# Patient Record
Sex: Female | Born: 1960 | Hispanic: Yes | Marital: Married | Smoking: Never smoker
Health system: Southern US, Community
[De-identification: ages and names within clinical notes are randomized; demographics above are authoritative.]

---

## 2004-02-25 ENCOUNTER — Ambulatory Visit: Payer: Self-pay

## 2005-06-28 ENCOUNTER — Ambulatory Visit: Payer: Self-pay

## 2006-11-01 ENCOUNTER — Ambulatory Visit: Payer: Self-pay

## 2007-12-18 ENCOUNTER — Ambulatory Visit: Payer: Self-pay

## 2010-08-06 ENCOUNTER — Ambulatory Visit: Payer: Self-pay | Admitting: Family Medicine

## 2011-10-27 ENCOUNTER — Ambulatory Visit: Payer: Self-pay | Admitting: Nurse Practitioner

## 2013-01-16 ENCOUNTER — Ambulatory Visit: Payer: Self-pay

## 2013-02-15 ENCOUNTER — Ambulatory Visit: Payer: Self-pay | Admitting: Family Medicine

## 2014-05-28 ENCOUNTER — Ambulatory Visit: Payer: Self-pay

## 2014-06-08 ENCOUNTER — Emergency Department: Payer: Self-pay | Admitting: Internal Medicine

## 2015-07-15 ENCOUNTER — Ambulatory Visit: Payer: Self-pay | Attending: Oncology

## 2015-07-15 ENCOUNTER — Ambulatory Visit
Admission: RE | Admit: 2015-07-15 | Discharge: 2015-07-15 | Disposition: A | Discharge status: Home / Self Care | Payer: Self-pay | Source: Ambulatory Visit | Attending: Oncology | Admitting: Oncology

## 2015-07-15 VITALS — BP 136/78 | HR 83 | Temp 98.0°F | Resp 20 | Ht 59.06 in | Wt 151.2 lb

## 2015-07-15 DIAGNOSIS — Z0000 Encounter for general adult medical examination without abnormal findings: Secondary | ICD-10-CM

## 2015-07-15 NOTE — Progress Notes (Signed)
Subjective:     Patient ID: Kari Davila, female   DOB: Feb 14, 1961, 55 y.o.   MRN: [id number]  HPI   Review of Systems     Objective:   Physical Exam     Assessment:     55 year old hispanic patient [facility]  Patient screened, and meets [facility] eligibility.  Patient does not have insurance, Medicare or Medicaid.  Handout given on Affordable Care Act.  Instructed patient on breast self-exam using teach back method.  CBE unremarkable. No mass or lump palpated.   Jaqui interpreted exam.      Plan:     Sent for bilateral screening mammogram.

## 2015-08-13 NOTE — Progress Notes (Signed)
Letter mailed from [facility] to notify of normal mammogram results.  Patient to return in one year for annual screening.  Copy to HSIS. 

## 2016-09-12 ENCOUNTER — Ambulatory Visit
Admission: RE | Admit: 2016-09-12 | Discharge: 2016-09-12 | Disposition: A | Discharge status: Home / Self Care | Payer: Self-pay | Source: Ambulatory Visit | Attending: Oncology | Admitting: Oncology

## 2016-09-12 ENCOUNTER — Ambulatory Visit: Payer: Self-pay | Attending: Oncology | Admitting: *Deleted

## 2016-09-12 ENCOUNTER — Encounter: Payer: Self-pay | Admitting: *Deleted

## 2016-09-12 ENCOUNTER — Other Ambulatory Visit: Payer: Self-pay | Admitting: *Deleted

## 2016-09-12 VITALS — BP 120/73 | HR 83 | Temp 98.3°F | Ht <= 58 in | Wt 140.0 lb

## 2016-09-12 DIAGNOSIS — Z0000 Encounter for general adult medical examination without abnormal findings: Secondary | ICD-10-CM

## 2016-09-12 DIAGNOSIS — N6489 Other specified disorders of breast: Secondary | ICD-10-CM

## 2016-09-12 NOTE — Progress Notes (Signed)
Subjective:     Patient ID: Olam IdlerCelia Lopez Davila, female   DOB: [birth year], 56 y.o.   MRN: [id number]  HPI   Review of Systems     Objective:   Physical Exam  Pulmonary/Chest: Right breast exhibits no inverted nipple, no mass, no nipple discharge, no skin change and no tenderness. Left breast exhibits no inverted nipple, no mass, no nipple discharge, no skin change and no tenderness. Breasts are symmetrical.         Assessment:     56 year old Hispanic [facility]  Kari Davila, the interpreter present during the interview and exam.  Clinical breast exam unremarkable.  Taught self breast awareness.  There is visible asymmetry along the right clavicle.  Patient states she noticed this about six months ago.  There is raised hard asymmetrical thickening along the right clavicle extending almost to the thyroid.  I have encouraged the patient to follow-up with [facility] for [facility].  Patient thinks it is from "carrying her granddaughter".  Explained that swelling might be expected from over exertion, but this is hard like her bone.  Denies any trauma or falls.  Patient has been screened for eligibility.  She does not have any insurance, Medicare or Medicaid.  She also meets financial eligibility.  Hand-out given on the Affordable Care Act.      Plan:     Screening mammogram ordered.  Will follow-up per protocol.  Again encouraged patient to see her primary care provider for further evaluation of the right clavicle asymmetry.

## 2016-09-12 NOTE — Patient Instructions (Signed)
Gave patient hand-out, Women Staying Healthy, Active and Well from [facility], with education on breast health, pap smears, heart and colon health. 

## 2016-09-19 ENCOUNTER — Ambulatory Visit
Admission: RE | Admit: 2016-09-19 | Discharge: 2016-09-19 | Disposition: A | Discharge status: Home / Self Care | Payer: Self-pay | Source: Ambulatory Visit | Attending: Oncology | Admitting: Oncology

## 2016-09-19 DIAGNOSIS — N6489 Other specified disorders of breast: Secondary | ICD-10-CM

## 2016-09-23 ENCOUNTER — Encounter: Payer: Self-pay | Admitting: *Deleted

## 2016-09-23 NOTE — Progress Notes (Signed)
Letter mailed from the [facility] to inform patient of her normal mammogram results.  Patient is to follow-up with annual screening in one year.  HSIS to Christy. 

## 2017-11-29 ENCOUNTER — Ambulatory Visit
Admission: RE | Admit: 2017-11-29 | Discharge: 2017-11-29 | Disposition: A | Discharge status: Home / Self Care | Payer: Self-pay | Source: Ambulatory Visit | Attending: Oncology | Admitting: Oncology

## 2017-11-29 ENCOUNTER — Encounter: Payer: Self-pay | Admitting: *Deleted

## 2017-11-29 ENCOUNTER — Encounter (INDEPENDENT_AMBULATORY_CARE_PROVIDER_SITE_OTHER): Payer: Self-pay

## 2017-11-29 ENCOUNTER — Other Ambulatory Visit: Payer: Self-pay

## 2017-11-29 ENCOUNTER — Ambulatory Visit: Payer: Self-pay | Attending: Oncology | Admitting: *Deleted

## 2017-11-29 VITALS — BP 114/79 | HR 76 | Temp 98.2°F | Ht 60.0 in | Wt 146.0 lb

## 2017-11-29 DIAGNOSIS — Z0000 Encounter for general adult medical examination without abnormal findings: Secondary | ICD-10-CM | POA: Insufficient documentation

## 2017-11-29 NOTE — Progress Notes (Signed)
  Subjective:     Patient ID: Kari Davila, female   DOB: October 06, 1960, 57 y.o.   MRN: [id number]  HPI   Review of Systems     Objective:   Physical Exam  Pulmonary/Chest: Right breast exhibits no inverted nipple, no mass, no nipple discharge, no skin change and no tenderness. Left breast exhibits no inverted nipple, no mass, no nipple discharge, no skin change and no tenderness.    Abdominal: There is no splenomegaly or hepatomegaly.  Genitourinary: Rectal exam shows no mass. No labial fusion. There is no rash, tenderness, lesion or injury on the right labia. There is no rash, tenderness, lesion or injury on the left labia. Cervix exhibits no motion tenderness, no discharge and no friability. Right adnexum displays no mass, no tenderness and no fullness. Left adnexum displays no mass, no tenderness and no fullness. No erythema, tenderness or bleeding in the vagina. No foreign body in the vagina. No signs of injury around the vagina. No vaginal discharge found.         Assessment:     57 year old Hispanic female [facility].  Jake SharkHarold, from the language line used for interpretation during the exam.  Clinical brest exam unremarkable.  Taught self breast awareness.  Specimen collected for pap smear without difficulty.  Patient has been screened for eligibility.  She does not have any insurance, Medicare or Medicaid.  She also meets financial eligibility.  Hand-out given on the Affordable Care Act. Risk Assessment    Risk Scores      11/29/2017   Last edited by: Scarlett PrestoShaver, Anne F, RN   5-year risk: 0.6 %   Lifetime risk: 4.1 %        The gail risk model does not take into account all family members or other high risk cancers.  Patient does have an increased risk of breast and ovarian cancer based on patient's family history of a maternal cousin with ovarian cancer and a maternal aunt with uterine cancer.  Patient encouraged to get annual screening.        Plan:     Screening mammogram ordered.  Specimen for pap sent to the lab.  Will follow-up per [facility] protocol.

## 2017-11-29 NOTE — Patient Instructions (Signed)
Prueba del VPH HPV Test La prueba del virus del papiloma humano (VPH) se usa para detectar los tipos de infeccin por el VPH de alto riesgo. El VPH es un grupo de alrededor de 100 virus. Muchos de estos virus causan tumores dentro de los genitales, sobre ellos o a su alrededor. La mayora de los VPH provocan infecciones que suelen desaparecen sin tratamiento. Sin embargo, los tipos 6, 11, 16 y 18 del VPH se consideran de alto riesgo y pueden aumentar el riesgo de padecer cncer de cuello del tero o de ano si la infeccin no se trata. La prueba del VPH identifica las cadenas de ADN (genticas) de la infeccin por el VPH, por lo que tambin se denomina prueba de ADN para el VPH. Aunque el VPH se encuentra tanto en los hombres como en las mujeres, la prueba del VPH se usa solo para detectar un mayor riesgo de cncer en las mujeres:  Con una prueba de Papanicolaou anormal.  Despus del tratamiento de una prueba de Papanicolaou anormal.  Entre 30y 65aos.  Despus del tratamiento de una infeccin por el VPH de alto riesgo.  La prueba del VPH se puede hacer al mismo tiempo que un examen plvico y una prueba de Papanicolaou en mujeres de ms de 30 aos. Tanto la prueba del VPH como la prueba de Papanicolaou requieren una muestra de clulas del cuello del tero. Cmo debo prepararme para esta prueba?  No se haga lavados vaginales ni se d un bao durante 24 a 48 horas antes de la prueba o como se lo haya indicado el mdico.  No tenga sexo durante 24 a 48 horas antes de la prueba o como se lo haya indicado el mdico.  Es posible que se le pida que reprograme la prueba si est menstruando.  Se le pedir que orine antes de la prueba. Qu significan los resultados? Es su responsabilidad retirar el resultado del estudio. Consulte en el laboratorio o en el departamento en el que fue realizado el estudio cundo y cmo podr obtener los resultados. Hable con el mdico si tiene alguna pregunta sobre los  resultados. El resultado ser negativo o positivo. Significado de los resultados negativos del anlisis Un resultado negativo de la prueba del VPH significa que no se detect el VPH, y es muy probable que no tenga el virus. Significado de los resultados positivos del anlisis Un resultado positivo de la prueba del VPH indica que tiene el virus.  Si el resultado de la prueba muestra la presencia de alguna cadena del VPH de alto riesgo, puede tener mayor riesgo de padecer cncer de cuello del tero o de ano si la infeccin no se trata.  Si se encuentran cadenas del VPH de bajo riesgo, es poco probable que tenga un alto riesgo de padecer cncer.  Hable con el mdico sobre los resultados. El mdico utilizar los resultados para realizar un diagnstico y determinar un plan de tratamiento adecuado para usted. Hable con el mdico sobre los resultados, las opciones de tratamiento y, si es necesario, la necesidad de realizar ms estudios. Hable con el mdico si tiene alguna pregunta sobre los resultados. Esta informacin no tiene como fin reemplazar el consejo del mdico. Asegrese de hacerle al mdico cualquier pregunta que tenga. Document Released: 07/14/2008 Document Revised: 06/16/2016 Document Reviewed: 08/13/2013 Elsevier Interactive Patient Education  2018 Elsevier Inc.  Gave patient hand-out, Women Staying Healthy, Active and Well from [facility], with education on breast health, pap smears, heart and colon health.  

## 2017-12-04 LAB — PAP LB AND HPV HIGH-RISK
HPV, high-risk: NEGATIVE
PAP SMEAR COMMENT: 0

## 2017-12-05 ENCOUNTER — Encounter: Payer: Self-pay | Admitting: *Deleted

## 2017-12-05 NOTE — Progress Notes (Unsigned)
Letter mailed to inform patient of her normal mammogram and pap smear.  Next pap in 5 years and mammogram in 1 year.  [facility] [place].

## 2018-12-11 ENCOUNTER — Encounter (INDEPENDENT_AMBULATORY_CARE_PROVIDER_SITE_OTHER): Payer: Self-pay

## 2018-12-11 ENCOUNTER — Ambulatory Visit
Admission: RE | Admit: 2018-12-11 | Discharge: 2018-12-11 | Disposition: A | Discharge status: Home / Self Care | Payer: Self-pay | Source: Ambulatory Visit | Attending: Oncology | Admitting: Oncology

## 2018-12-11 ENCOUNTER — Ambulatory Visit: Payer: Self-pay | Attending: Oncology | Admitting: *Deleted

## 2018-12-11 ENCOUNTER — Other Ambulatory Visit: Payer: Self-pay

## 2018-12-11 ENCOUNTER — Encounter: Payer: Self-pay | Admitting: *Deleted

## 2018-12-11 VITALS — BP 137/81 | HR 75 | Temp 98.0°F | Ht 59.0 in | Wt 150.0 lb

## 2018-12-11 DIAGNOSIS — Z0000 Encounter for general adult medical examination without abnormal findings: Secondary | ICD-10-CM

## 2018-12-11 NOTE — Progress Notes (Signed)
  Subjective:     Patient ID: Kari Davila, female   DOB: 1960/06/13, 58 y.o.   MRN: [id number]  HPI   Review of Systems     Objective:   Physical Exam Chest:     Breasts: Breasts are symmetrical.        Right: No swelling, bleeding, inverted nipple, mass, nipple discharge or skin change.        Left: No swelling, bleeding, inverted nipple, mass, nipple discharge, skin change or tenderness.  Lymphadenopathy:     Upper Body:     Right upper body: No supraclavicular or axillary adenopathy.     Left upper body: No supraclavicular or axillary adenopathy.        Assessment:    58 year old Hispanic female returns to [facility] for annual screening.  Kari Davila, the interpreter present during the interview and exam.  Clinical breast exam unremarkable.  Taught self breast awareness.  Last pap on 11/29/17 was negative / negative.  Next pap due in 2024.  Patient has been screened for eligibility.  She does not have any insurance, Medicare or Medicaid.  She also meets financial eligibility.  Hand-out given on the Affordable Care Act. Risk Assessment    Risk Scores      12/11/2018 11/29/2017   Last edited by: Theodore Demark, RN Theodore Demark, RN   5-year risk: 0.6 % 0.6 %   Lifetime risk: 4 % 4.1 %            Plan:     Screening mammogram ordered.  Will follow up per [facility] protocol.

## 2018-12-13 ENCOUNTER — Encounter: Payer: Self-pay | Admitting: *Deleted

## 2018-12-13 NOTE — Progress Notes (Signed)
Letter mailed from the [facility] to inform patient of her normal mammogram results.  Patient is to follow-up with annual screening in one year.  HSIS to Christy. 

## 2019-06-30 ENCOUNTER — Ambulatory Visit: Payer: Self-pay | Attending: Internal Medicine

## 2019-06-30 DIAGNOSIS — Z23 Encounter for immunization: Secondary | ICD-10-CM

## 2019-06-30 NOTE — Progress Notes (Signed)
   Covid-19 Vaccination Clinic  Name:  Kari Davila    MRN: [id number] DOB: [birth year]  06/30/2019  Ms. Kari Davila was observed post Covid-19 immunization for 15 minutes without incident. She was provided with Vaccine Information Sheet and instruction to access the V-Safe system.   Ms. Kari Davila was instructed to call 911 with any severe reactions post vaccine: Marland Kitchen Difficulty breathing  . Swelling of face and throat  . A fast heartbeat  . A bad rash all over body  . Dizziness and weakness   Immunizations Administered    Name Date Dose VIS Date Route   Pfizer COVID-19 Vaccine 06/30/2019  6:37 PM 0.3 mL 03/22/2019 Intramuscular   Manufacturer: [employer], [employer]   Lot: [id number]   NDC: [address]-3

## 2019-07-21 ENCOUNTER — Ambulatory Visit: Payer: Self-pay | Attending: Internal Medicine

## 2019-07-21 DIAGNOSIS — Z23 Encounter for immunization: Secondary | ICD-10-CM

## 2019-07-21 NOTE — Progress Notes (Signed)
   Covid-19 Vaccination Clinic  Name:  Kari Davila    MRN: [id number] DOB: April 27, 1960  07/21/2019  Ms. Kari Davila was observed post Covid-19 immunization for 30 minutes based on pre-vaccination screening without incident. She was provided with Vaccine Information Sheet and instruction to access the V-Safe system. Medical interpreter used.  Ms. Kari Davila was instructed to call 911 with any severe reactions post vaccine: Marland Kitchen Difficulty breathing  . Swelling of face and throat  . A fast heartbeat  . A bad rash all over body  . Dizziness and weakness   Immunizations Administered    Name Date Dose VIS Date Route   Pfizer COVID-19 Vaccine 07/21/2019  4:52 PM 0.3 mL 03/22/2019 Intramuscular   Manufacturer: [employer], [employer]   Lot: [id number]   NDC: [address]-0

## 2019-09-03 IMAGING — MG MM DIGITAL SCREENING BILAT W/ TOMO W/ CAD
8 series · 9 of 24 positions shown · non-contrast
Comparison: Previous exam(s).

CLINICAL DATA: Screening.

EXAM:
DIGITAL SCREENING BILATERAL MAMMOGRAM WITH TOMO AND CAD

[L MLO synth-2D]
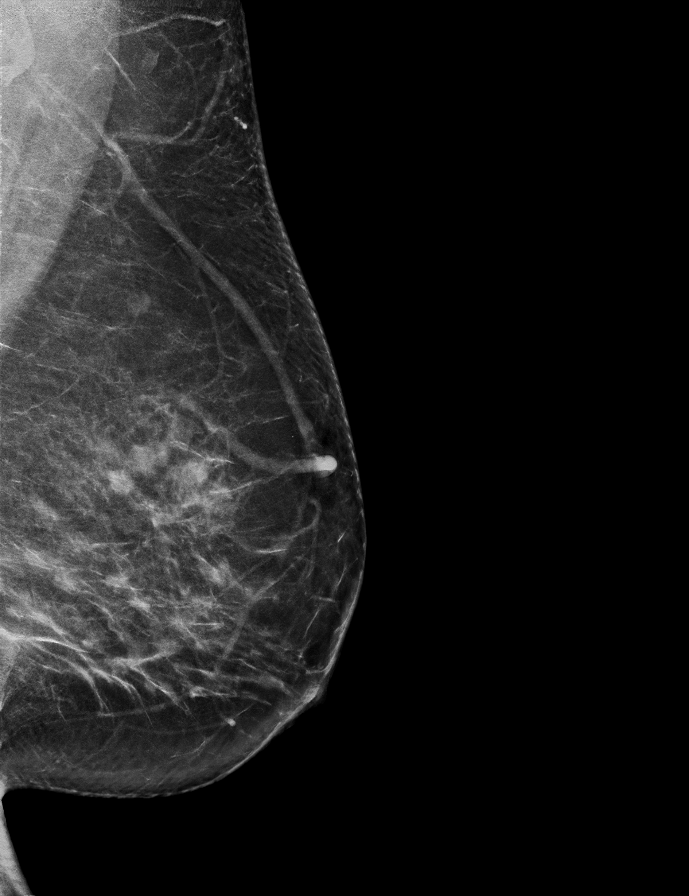

[L CC synth-2D]
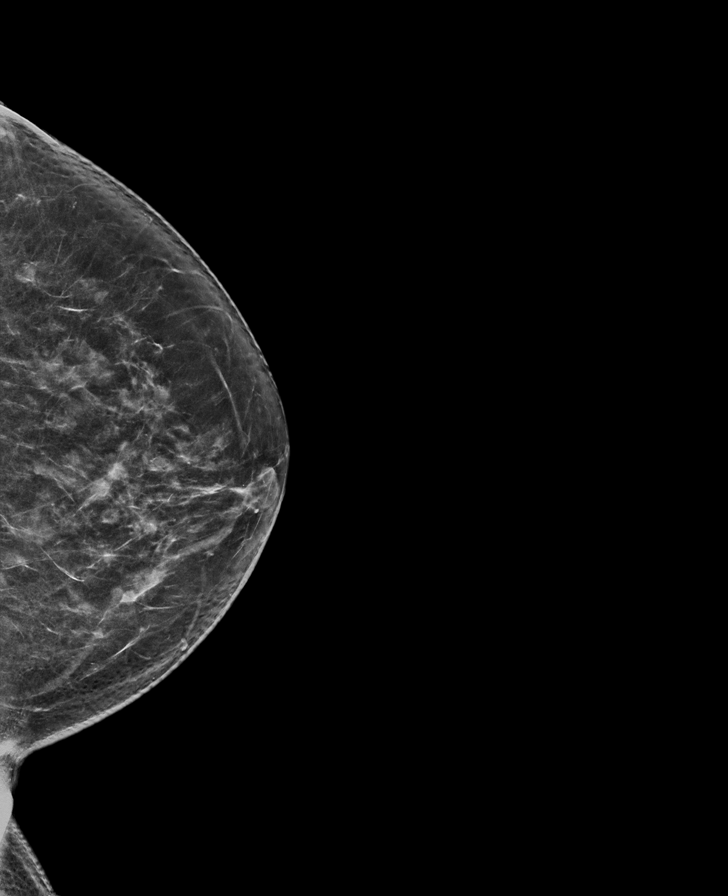

[R CC synth-2D]
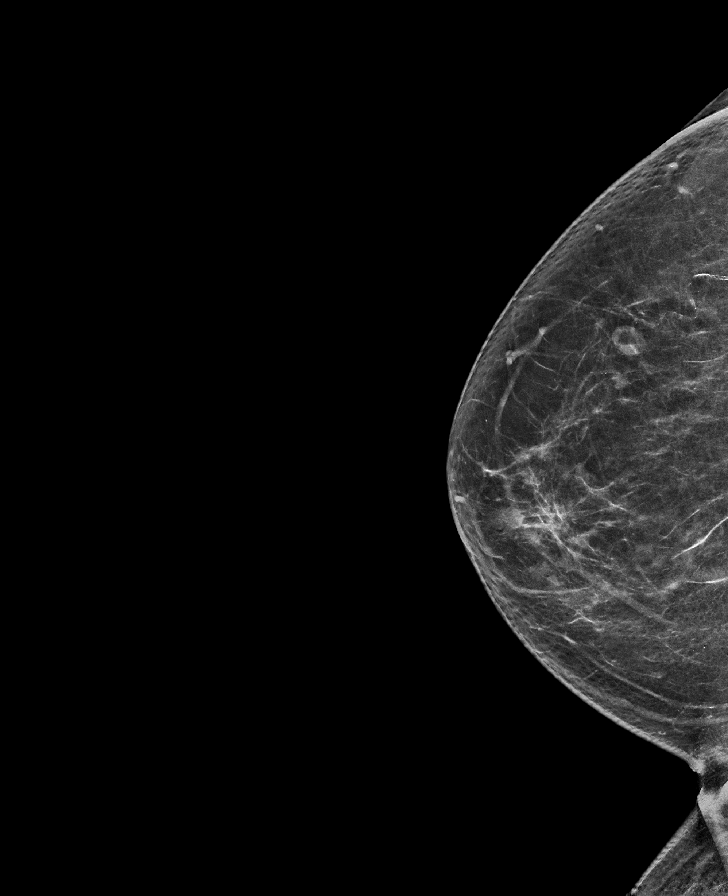

[R MLO synth-2D]
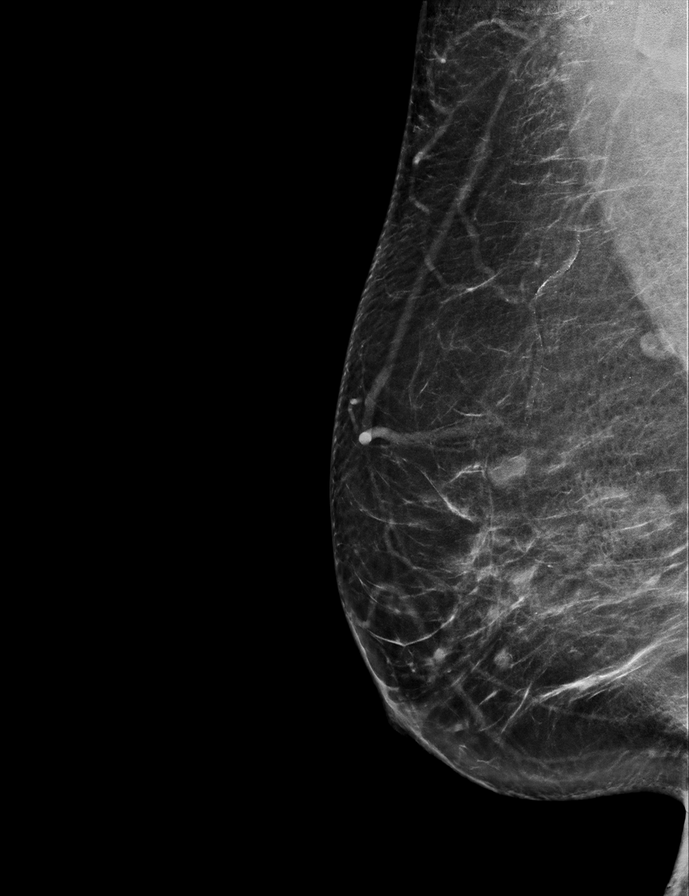

[L CC tomo · 2 of 70 frames shown]
[frame 23/70]
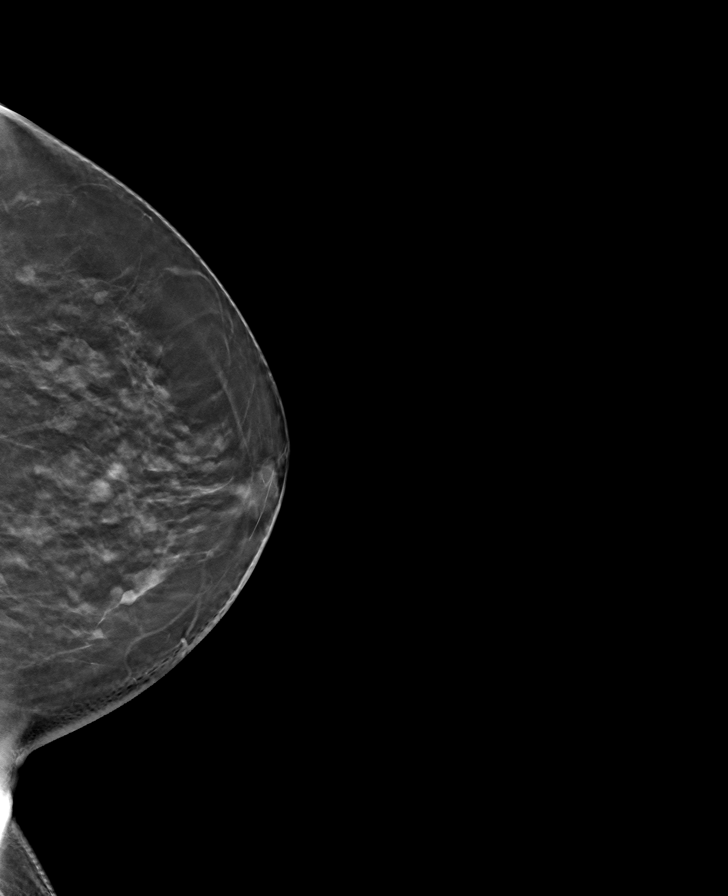
[frame 35/70]
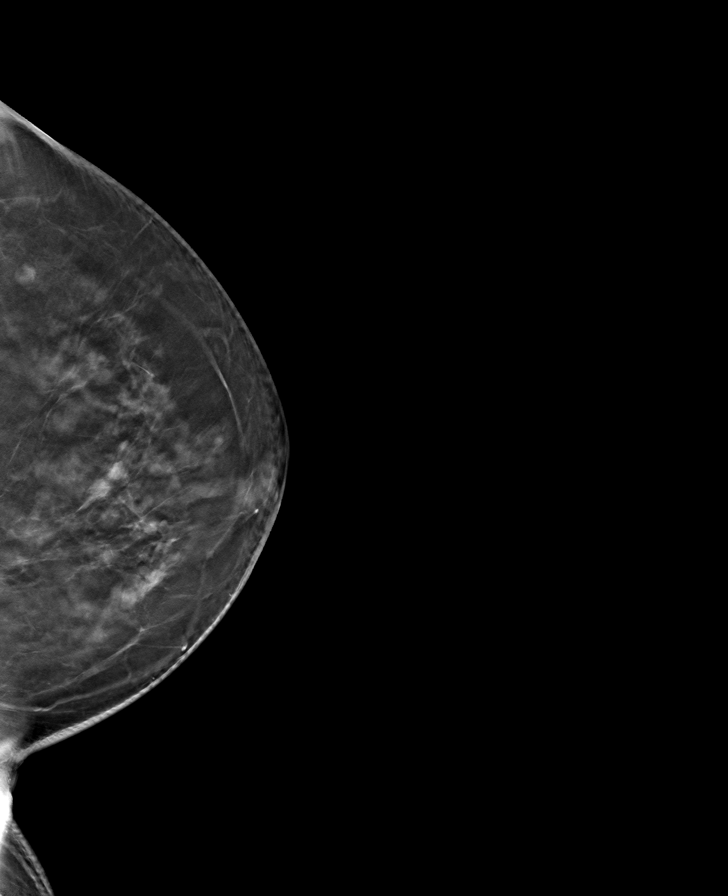

[R CC tomo · tomo slice 37/72.0]
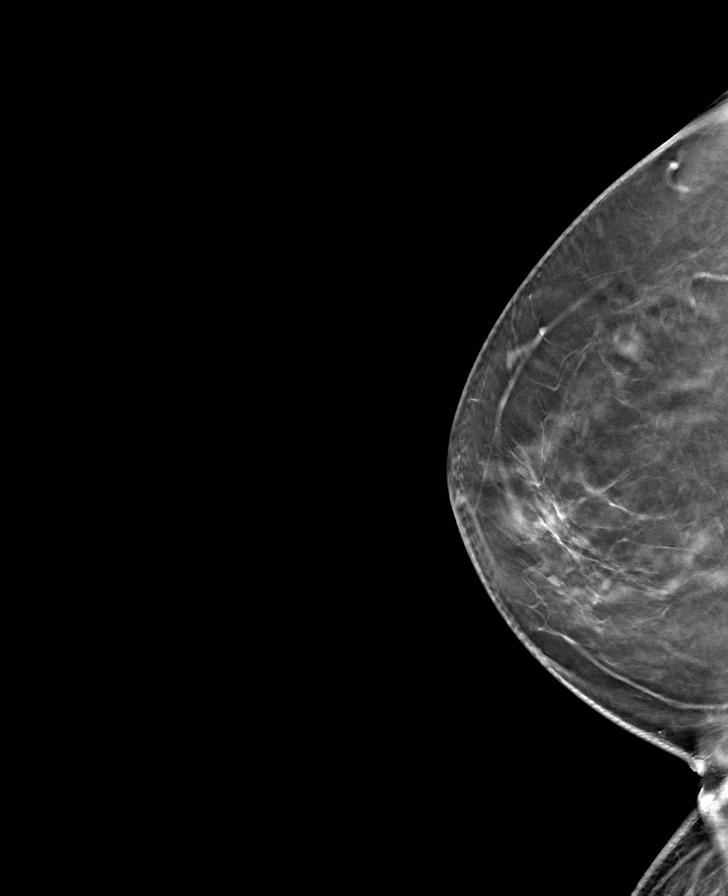

[R MLO tomo · tomo slice 37/74.0]
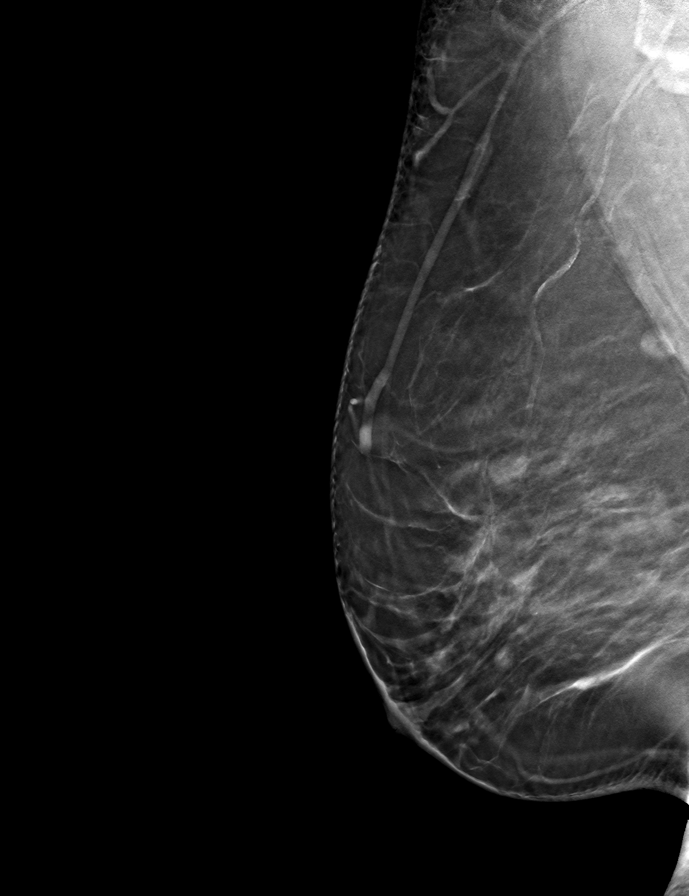

[L MLO tomo · tomo slice 36/71.0]
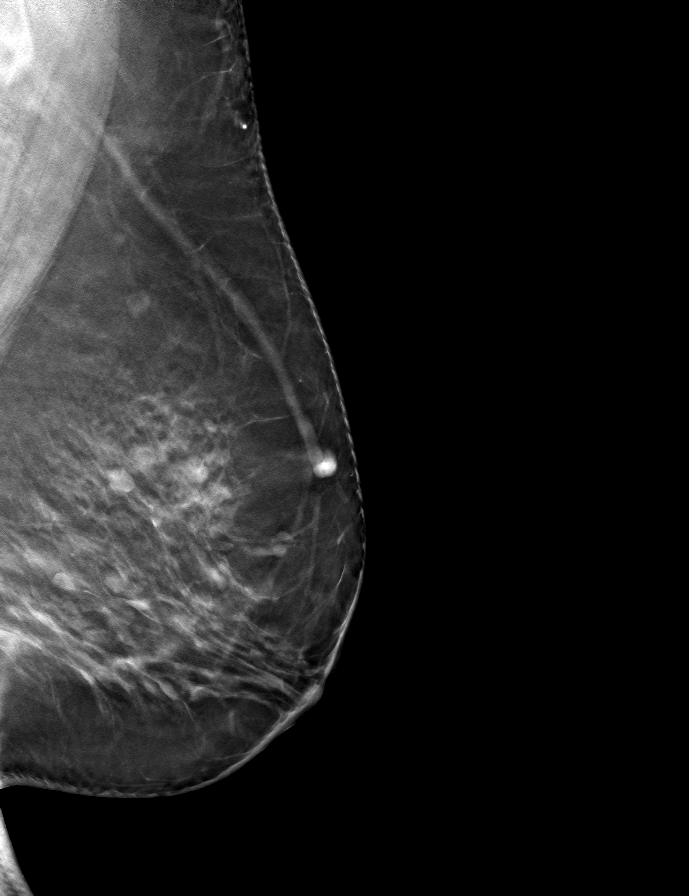

[9 of 24 positions shown; findings below may reference images not displayed]

ACR Breast Density Category b: There are scattered areas of
fibroglandular density.
FINDINGS: There are no findings suspicious for malignancy. Images were
processed with CAD.
IMPRESSION: No mammographic evidence of malignancy. A result letter of this
screening mammogram will be mailed directly to the patient.

RECOMMENDATION:
Screening mammogram in one year. (Code:CN-U-775)

BI-RADS CATEGORY  1: Negative.

## 2020-01-15 ENCOUNTER — Ambulatory Visit: Payer: Self-pay | Attending: Oncology

## 2020-01-15 ENCOUNTER — Other Ambulatory Visit: Payer: Self-pay

## 2020-01-15 ENCOUNTER — Ambulatory Visit
Admission: RE | Admit: 2020-01-15 | Discharge: 2020-01-15 | Disposition: A | Discharge status: Home / Self Care | Payer: Self-pay | Source: Ambulatory Visit | Attending: Oncology | Admitting: Oncology

## 2020-01-15 VITALS — BP 110/62 | HR 67 | Temp 97.8°F | Ht 59.25 in | Wt 135.0 lb

## 2020-01-15 DIAGNOSIS — Z0000 Encounter for general adult medical examination without abnormal findings: Secondary | ICD-10-CM

## 2020-01-15 NOTE — Progress Notes (Signed)
  Subjective:     Patient ID: Kari Davila, female   DOB: 09/12/1960, 59 y.o.   MRN: [id number]  HPI   Review of Systems     Objective:   Physical Exam Chest:     Breasts:        Right: Inverted nipple present. No swelling, bleeding, mass, nipple discharge, skin change or tenderness.        Left: No swelling, bleeding, inverted nipple, mass, nipple discharge, skin change or tenderness.       Comments: Horizontal nipple inversion       Assessment:     59 year old Hispanic patient returns for annual [facility] screening.  [employer] interpreter Christiane Ha interpreted exam.  Patient screened, and meets BCCCP eligibility.  Patient does not have insurance, Medicare or Medicaid.  Instructed patient on breast self awareness using teach back method.  Clinical breast exam unremarkable.  No mass or lump palpated.   Risk Assessment    Risk Scores      01/15/2020 12/11/2018   Last edited by: Alta Corning, CMA Scarlett Presto, RN   5-year risk: 0.6 % 0.6 %   Lifetime risk: 3.9 % 4 %            Plan:     Sent for bilateral screening mammogram.

## 2020-01-17 NOTE — Progress Notes (Signed)
Letter mailed from [facility] to notify of normal mammogram results.  Patient to return in one year for annual screening.  Copy to HSIS. 

## 2021-01-19 ENCOUNTER — Other Ambulatory Visit: Payer: Self-pay

## 2021-01-19 ENCOUNTER — Ambulatory Visit
Admission: RE | Admit: 2021-01-19 | Discharge: 2021-01-19 | Disposition: A | Discharge status: Home / Self Care | Payer: Self-pay | Source: Ambulatory Visit | Attending: Oncology | Admitting: Oncology

## 2021-01-19 ENCOUNTER — Ambulatory Visit: Payer: Self-pay | Attending: Oncology

## 2021-01-19 DIAGNOSIS — Z0000 Encounter for general adult medical examination without abnormal findings: Secondary | ICD-10-CM | POA: Insufficient documentation

## 2021-01-19 NOTE — Progress Notes (Signed)
Patient pre-screened for [facility] eligibility due to COVID 19 precautions. Two patient identifiers used for verification that I was speaking to correct patient.  Patient to Present directly to [facility] today for [facility] screening mammogram.  Risk Assessment     Risk Scores       01/19/2021 01/15/2020   Last edited by: Scarlett Presto, RN [place], Freada Bergeron, CMA   5-year risk: 0.7 % 0.6 %   Lifetime risk: 3.8 % 3.9 %

## 2021-02-21 NOTE — Progress Notes (Unsigned)
Letter mailed from [facility] to notify of normal mammogram results.  Patient to return in one year for annual screening.  Copy to HSIS. 

## 2022-02-10 ENCOUNTER — Other Ambulatory Visit: Payer: Self-pay

## 2022-02-10 DIAGNOSIS — Z1231 Encounter for screening mammogram for malignant neoplasm of breast: Secondary | ICD-10-CM

## 2022-02-15 ENCOUNTER — Ambulatory Visit: Payer: Self-pay | Attending: Hematology and Oncology | Admitting: Hematology and Oncology

## 2022-02-15 ENCOUNTER — Ambulatory Visit
Admission: RE | Admit: 2022-02-15 | Discharge: 2022-02-15 | Disposition: A | Discharge status: Home / Self Care | Payer: Self-pay | Source: Ambulatory Visit | Attending: Obstetrics and Gynecology | Admitting: Obstetrics and Gynecology

## 2022-02-15 VITALS — BP 141/91 | Wt 145.7 lb

## 2022-02-15 DIAGNOSIS — Z1231 Encounter for screening mammogram for malignant neoplasm of breast: Secondary | ICD-10-CM

## 2022-02-15 NOTE — Patient Instructions (Signed)
[place] about breast self awareness. Patient did not need a Pap smear today due to last Pap smear was in 2019 per patient. We will repeat this next year. Let her know [facility] will cover Pap smears every 5 years unless has a history of abnormal Pap smears. Referred patient to the [facility] for screening mammogram. Appointment scheduled for 02/15/2022. Patient aware of appointment and will be there. Let patient know will follow up with her within the next couple weeks with results. Kari Davila verbalized understanding. She will return to clinic in one year for mammogram and pap smear.   Melodye Ped, NP 10:53 AM

## 2022-02-15 NOTE — Progress Notes (Signed)
Kari Davila is a 61 y.o. female who presents to [facility] clinic today with no complaints.    Pap Smear: Pap not smear completed today. Last Pap smear was 2019 at [facility] and was normal. Per patient has no history of an abnormal Pap smear. Last Pap smear result is available in Epic.   Physical exam: Breasts Breasts symmetrical. No skin abnormalities bilateral breasts. No nipple retraction bilateral breasts. No nipple discharge bilateral breasts. No lymphadenopathy. No lumps palpated bilateral breasts.     MS DIGITAL SCREENING TOMO BILATERAL  Result Date: 01/22/2021 CLINICAL DATA:  Screening. EXAM: DIGITAL SCREENING BILATERAL MAMMOGRAM WITH TOMOSYNTHESIS AND CAD TECHNIQUE: Bilateral screening digital craniocaudal and mediolateral oblique mammograms were obtained. Bilateral screening digital breast tomosynthesis was performed. The images were evaluated with computer-aided detection. COMPARISON:  Previous exam(s). ACR Breast Density Category b: There are scattered areas of fibroglandular density. FINDINGS: There are no findings suspicious for malignancy. IMPRESSION: No mammographic evidence of malignancy. A result letter of this screening mammogram will be mailed directly to the patient. RECOMMENDATION: Screening mammogram in one year. (Code:SM-B-01Y) BI-RADS CATEGORY  1: Negative. Electronically Signed   By: Sherian Rein M.D.   On: 01/22/2021 10:37  MS DIGITAL SCREENING TOMO BILATERAL  Result Date: 01/16/2020 CLINICAL DATA:  Screening. EXAM: DIGITAL SCREENING BILATERAL MAMMOGRAM WITH TOMO AND CAD COMPARISON:  Previous exam(s). ACR Breast Density Category b: There are scattered areas of fibroglandular density. FINDINGS: There are no findings suspicious for malignancy. Images were processed with CAD. IMPRESSION: No mammographic evidence of malignancy. A result letter of this screening mammogram will be mailed directly to the patient. RECOMMENDATION: Screening mammogram in one year.  (Code:SM-B-01Y) BI-RADS CATEGORY  1: Negative. Electronically Signed   By: Gerome Sam III M.D   On: 01/16/2020 13:49   MS DIGITAL SCREENING TOMO BILATERAL  Result Date: 12/12/2018 CLINICAL DATA:  Screening. EXAM: DIGITAL SCREENING BILATERAL MAMMOGRAM WITH TOMO AND CAD COMPARISON:  Previous exam(s). ACR Breast Density Category c: The breast tissue is heterogeneously dense, which may obscure small masses. FINDINGS: There are no findings suspicious for malignancy. Images were processed with CAD. IMPRESSION: No mammographic evidence of malignancy. A result letter of this screening mammogram will be mailed directly to the patient. RECOMMENDATION: Screening mammogram in one year. (Code:SM-B-01Y) BI-RADS CATEGORY  1: Negative. Electronically Signed   By: Bary Richard M.D.   On: 12/12/2018 08:13   MS DIGITAL SCREENING TOMO BILATERAL  Result Date: 11/29/2017 CLINICAL DATA:  Screening. EXAM: DIGITAL SCREENING BILATERAL MAMMOGRAM WITH TOMO AND CAD COMPARISON:  Previous exam(s). ACR Breast Density Category b: There are scattered areas of fibroglandular density. FINDINGS: There are no findings suspicious for malignancy. Images were processed with CAD. IMPRESSION: No mammographic evidence of malignancy. A result letter of this screening mammogram will be mailed directly to the patient. RECOMMENDATION: Screening mammogram in one year. (Code:SM-B-01Y) BI-RADS CATEGORY  1: Negative. Electronically Signed   By: Britta Mccreedy M.D.   On: 11/29/2017 12:47      Pelvic/Bimanual Pap is not indicated today    Smoking History: Patient has never smoked and was not referred to quit line.    Patient Navigation: Patient education provided. Access to services provided for patient through [facility]CP program. Karolee Stamps interpreter provided. No transportation provided   Colorectal Cancer Screening: Per patient has never had colonoscopy completed No complaints today. Declined FIT test today. She would like to have this  done at [facility] clinic.    Breast and Cervical Cancer Risk Assessment:  Patient does not have family history of breast cancer, known genetic mutations, or radiation treatment to the chest before age 52. Patient does not have history of cervical dysplasia, immunocompromised, or DES exposure in-utero.  Risk Assessment   No risk assessment data for the current encounter  Risk Scores       01/19/2021   Last edited by: Theodore Demark, RN   5-year risk: 0.7 %   Lifetime risk: 3.8 %            A: [facility]CP exam without pap smear No complaints with benign exam.   P: Referred [facility] to the [facility] for a screening mammogram. Appointment scheduled 02/15/2022.  Dayton Scrape A, NP 02/15/2022 10:29 AM

## 2022-11-14 ENCOUNTER — Ambulatory Visit: Payer: Self-pay | Attending: Hematology and Oncology | Admitting: Hematology and Oncology

## 2022-11-14 VITALS — BP 146/89 | Wt 139.0 lb

## 2022-11-14 DIAGNOSIS — Z124 Encounter for screening for malignant neoplasm of cervix: Secondary | ICD-10-CM

## 2022-11-14 NOTE — Progress Notes (Signed)
Patient: Kari Davila           Date of Birth: September 11, 1960           MRN: [id number] Visit Date: 11/14/2022 PCP: Hillery Aldo, MD     Cervical Exam Pap smear completed: Pap test Abnormal Observations: Normal exam.  Recommendations: Repeat in 5 years if normal and negative HPV.     Patient's History There are no problems to display for this patient.  No past medical history on file.  Family History  Problem Relation Age of Onset  . Breast cancer Neg Hx     Social History   Occupational History  . Not on file  Tobacco Use  . Smoking status: Never  . Smokeless tobacco: Never  Vaping Use  . Vaping status: Never Used  Substance and Sexual Activity  . Alcohol use: Not Currently  . Drug use: Never  . Sexual activity: Yes    Birth control/protection: Post-menopausal

## 2023-04-19 ENCOUNTER — Other Ambulatory Visit: Payer: Self-pay

## 2023-04-19 DIAGNOSIS — Z1231 Encounter for screening mammogram for malignant neoplasm of breast: Secondary | ICD-10-CM

## 2023-04-20 ENCOUNTER — Other Ambulatory Visit: Payer: Self-pay

## 2023-05-16 ENCOUNTER — Ambulatory Visit
Admission: RE | Admit: 2023-05-16 | Discharge: 2023-05-16 | Disposition: A | Discharge status: Home / Self Care | Payer: Self-pay | Source: Ambulatory Visit | Attending: Obstetrics and Gynecology | Admitting: Obstetrics and Gynecology

## 2023-05-16 ENCOUNTER — Ambulatory Visit: Payer: Self-pay | Attending: Hematology and Oncology | Admitting: *Deleted

## 2023-05-16 VITALS — BP 143/79 | Wt 144.1 lb

## 2023-05-16 DIAGNOSIS — Z1239 Encounter for other screening for malignant neoplasm of breast: Secondary | ICD-10-CM

## 2023-05-16 DIAGNOSIS — Z1231 Encounter for screening mammogram for malignant neoplasm of breast: Secondary | ICD-10-CM | POA: Insufficient documentation

## 2023-05-16 NOTE — Progress Notes (Signed)
 Kari Davila is a 63 y.o. female who presents to [facility] clinic today with no complaints.    Pap Smear: Pap smear not completed today. Last Pap smear was 11/14/2022 at [facility] and was normal with negative HPV. Per patient has no history of an abnormal Pap smear. Last Pap smear result is available in Epic.   Physical exam: Breasts Breasts symmetrical. No skin abnormalities bilateral breasts. No nipple retraction bilateral breasts. No nipple discharge bilateral breasts. No lymphadenopathy. No lumps palpated bilateral breasts. No complaints of pain or tenderness on exam.     MS DIGITAL SCREENING TOMO BILATERAL Result Date: 02/16/2022 CLINICAL DATA:  Screening. EXAM: DIGITAL SCREENING BILATERAL MAMMOGRAM WITH TOMOSYNTHESIS AND CAD TECHNIQUE: Bilateral screening digital craniocaudal and mediolateral oblique mammograms were obtained. Bilateral screening digital breast tomosynthesis was performed. The images were evaluated with computer-aided detection. COMPARISON:  Previous exam(s). ACR Breast Density Category b: There are scattered areas of fibroglandular density. FINDINGS: There are no findings suspicious for malignancy. IMPRESSION: No mammographic evidence of malignancy. A result letter of this screening mammogram will be mailed directly to the patient. RECOMMENDATION: Screening mammogram in one year. (Code:SM-B-01Y) BI-RADS CATEGORY  1: Negative. Electronically Signed   By: Reyes Phi M.D.   On: 02/16/2022 14:04   MS DIGITAL SCREENING TOMO BILATERAL Result Date: 01/22/2021 CLINICAL DATA:  Screening. EXAM: DIGITAL SCREENING BILATERAL MAMMOGRAM WITH TOMOSYNTHESIS AND CAD TECHNIQUE: Bilateral screening digital craniocaudal and mediolateral oblique mammograms were obtained. Bilateral screening digital breast tomosynthesis was performed. The images were evaluated with computer-aided detection. COMPARISON:  Previous exam(s). ACR Breast Density Category b: There are scattered areas of  fibroglandular density. FINDINGS: There are no findings suspicious for malignancy. IMPRESSION: No mammographic evidence of malignancy. A result letter of this screening mammogram will be mailed directly to the patient. RECOMMENDATION: Screening mammogram in one year. (Code:SM-B-01Y) BI-RADS CATEGORY  1: Negative. Electronically Signed   By: Craig Farr M.D.   On: 01/22/2021 10:37  MS DIGITAL SCREENING TOMO BILATERAL Result Date: 01/16/2020 CLINICAL DATA:  Screening. EXAM: DIGITAL SCREENING BILATERAL MAMMOGRAM WITH TOMO AND CAD COMPARISON:  Previous exam(s). ACR Breast Density Category b: There are scattered areas of fibroglandular density. FINDINGS: There are no findings suspicious for malignancy. Images were processed with CAD. IMPRESSION: No mammographic evidence of malignancy. A result letter of this screening mammogram will be mailed directly to the patient. RECOMMENDATION: Screening mammogram in one year. (Code:SM-B-01Y) BI-RADS CATEGORY  1: Negative. Electronically Signed   By: Alm Pouch III M.D   On: 01/16/2020 13:49   MS DIGITAL SCREENING TOMO BILATERAL Result Date: 12/12/2018 CLINICAL DATA:  Screening. EXAM: DIGITAL SCREENING BILATERAL MAMMOGRAM WITH TOMO AND CAD COMPARISON:  Previous exam(s). ACR Breast Density Category c: The breast tissue is heterogeneously dense, which may obscure small masses. FINDINGS: There are no findings suspicious for malignancy. Images were processed with CAD. IMPRESSION: No mammographic evidence of malignancy. A result letter of this screening mammogram will be mailed directly to the patient. RECOMMENDATION: Screening mammogram in one year. (Code:SM-B-01Y) BI-RADS CATEGORY  1: Negative. Electronically Signed   By: Lael Hines M.D.   On: 12/12/2018 08:13    Pelvic/Bimanual Pap is not indicated today per BCCCP guidelines.   Smoking History: Patient has never smoked.   Patient Navigation: Patient education provided. Access to services provided for patient  through COMCAST program. Spanish interpreter Kari Davila from [facility] provided.   Colorectal Cancer Screening: Per patient has never had colonoscopy completed. FIT Test completed 04/22/2022 given by PCP  at [facility] and negative. Patient stated will follow up with PCP for this year. No complaints today.    Breast and Cervical Cancer Risk Assessment: Patient does not have family history of breast cancer, known genetic mutations, or radiation treatment to the chest before age 24. Patient does not have history of cervical dysplasia, immunocompromised, or DES exposure in-utero.  Risk Scores as of Encounter on 05/16/2023     Kari Davila           5-year 1.07%   Lifetime 5.18%   This patient is [place] but has no documented birth country, so the [place] model used data from [place] patients to calculate their risk score. Document a birth country in the Demographics activity for a more accurate score.         Last calculated by Rogerio Tempie SQUIBB, LPN on 10/13/7972 at  2:05 PM        A: BCCCP exam without pap smear No complaints.  P: Referred patient to the [facility] for a screening mammogram. Appointment scheduled Tuesday, May 16, 2023 at 1440.  Driscilla Wanda SQUIBB, RN 05/16/2023 2:08 PM

## 2023-05-16 NOTE — Patient Instructions (Signed)
 Explained breast self awareness with Caprice Wilfred Karvonen. Patient did not need a Pap smear today due to last Pap smear and HPV typing was 11/14/2022. Let her know BCCCP will cover Pap smears and HPV typing every 5 years unless has a history of abnormal Pap smears. Referred patient to the [facility] for a screening mammogram. Appointment scheduled Tuesday, May 16, 2023 at 1440. Patient aware of appointment and will be there. Let patient know Raymondo will follow up with her within the next couple weeks with results of mammogram by letter or phone. Caprice Wilfred Karvonen verbalized understanding.  Worth Kober, Wanda Ship, RN 2:08 PM
